# Patient Record
Sex: Female | Born: 1990
Health system: Southern US, Community
[De-identification: ages and names within clinical notes are randomized; demographics above are authoritative.]

## PROBLEM LIST (undated history)

## (undated) ENCOUNTER — Ambulatory Visit (HOSPITAL_COMMUNITY): Admission: EM | Payer: 59 | Source: Home / Self Care

---

## 2016-05-01 ENCOUNTER — Other Ambulatory Visit: Payer: Self-pay | Admitting: Nephrology

## 2016-05-01 DIAGNOSIS — R809 Proteinuria, unspecified: Secondary | ICD-10-CM

## 2016-05-03 ENCOUNTER — Ambulatory Visit
Admission: RE | Admit: 2016-05-03 | Discharge: 2016-05-03 | Disposition: A | Discharge status: Home / Self Care | Payer: BLUE CROSS/BLUE SHIELD | Source: Ambulatory Visit | Attending: Nephrology | Admitting: Nephrology

## 2016-05-03 DIAGNOSIS — R809 Proteinuria, unspecified: Secondary | ICD-10-CM

## 2017-10-15 IMAGING — US US RENAL
1 series · 14 of 25 positions shown · non-contrast
Comparison: None.

CLINICAL DATA: Proteinuria.

EXAM:
RENAL / URINARY TRACT ULTRASOUND COMPLETE

[Series 1: us renal · 0.20mm/px · 14 of 32 slices shown]
[im 1/32]
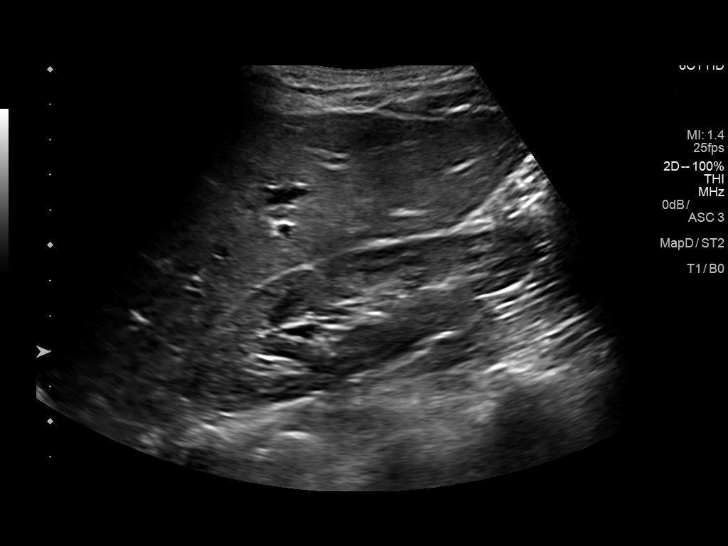
[im 3/32]
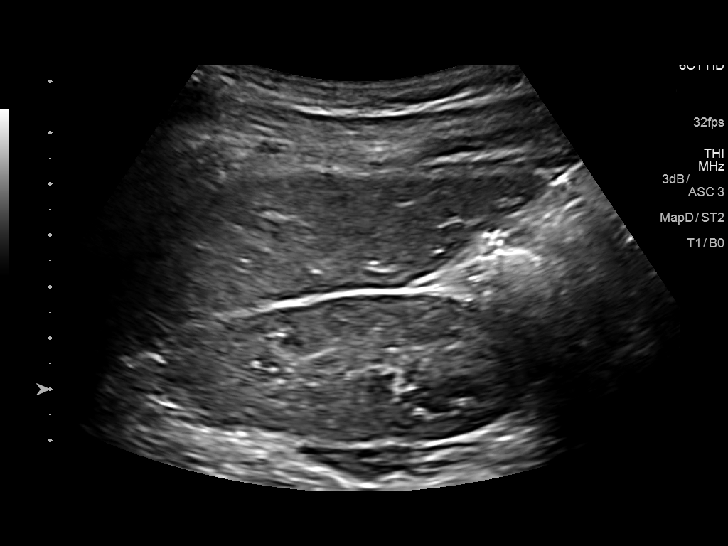
[im 6/32]
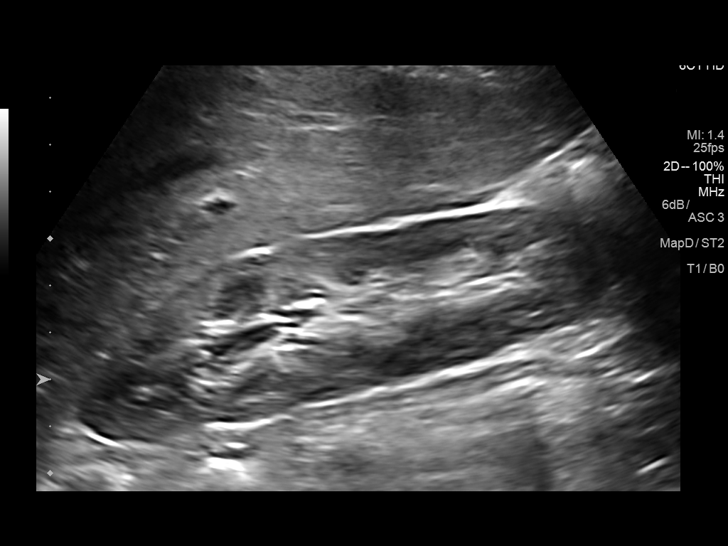
[im 8/32]
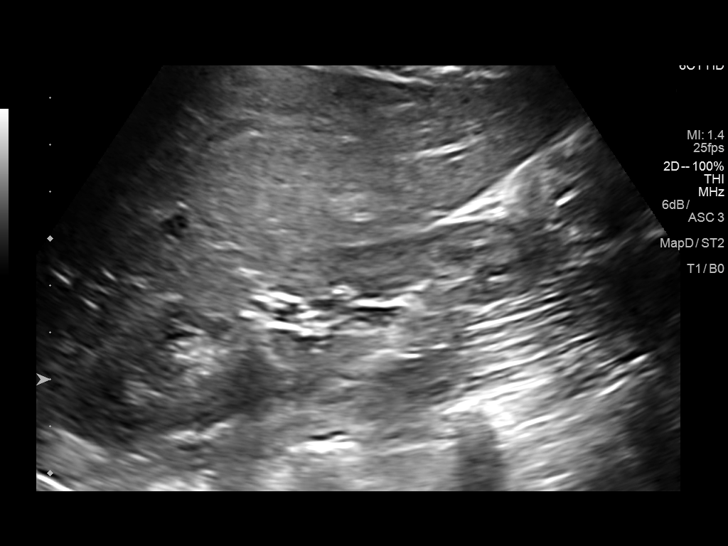
[im 11/32]
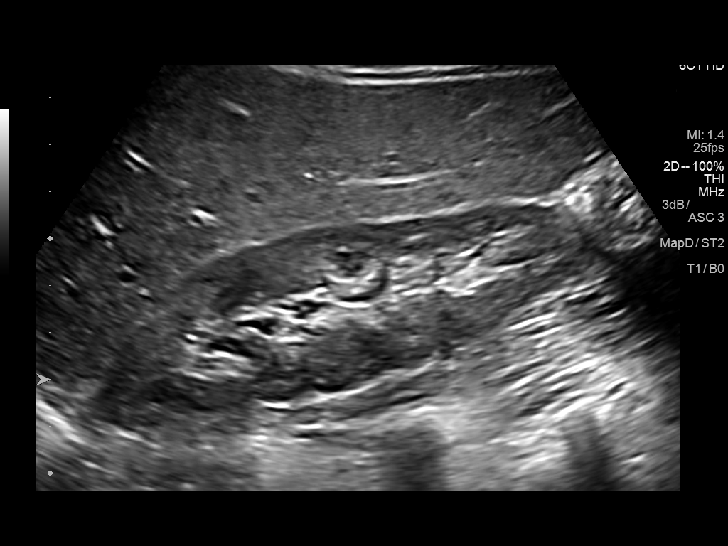
[im 12/32]
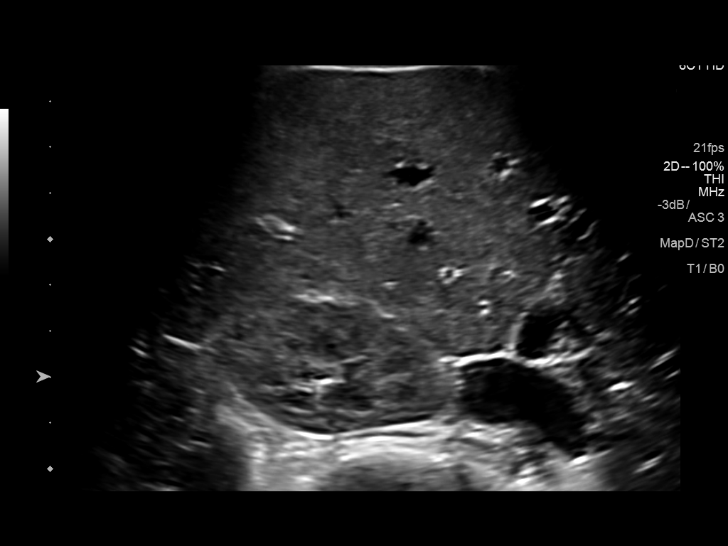
[im 15/32]
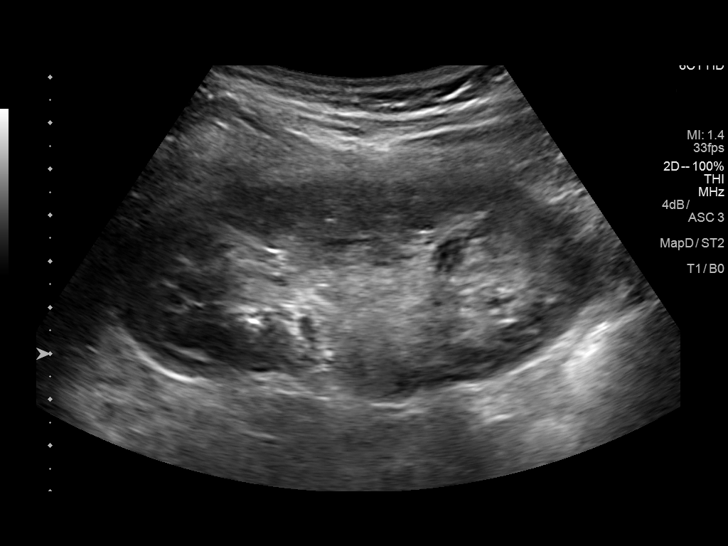
[im 17/32]
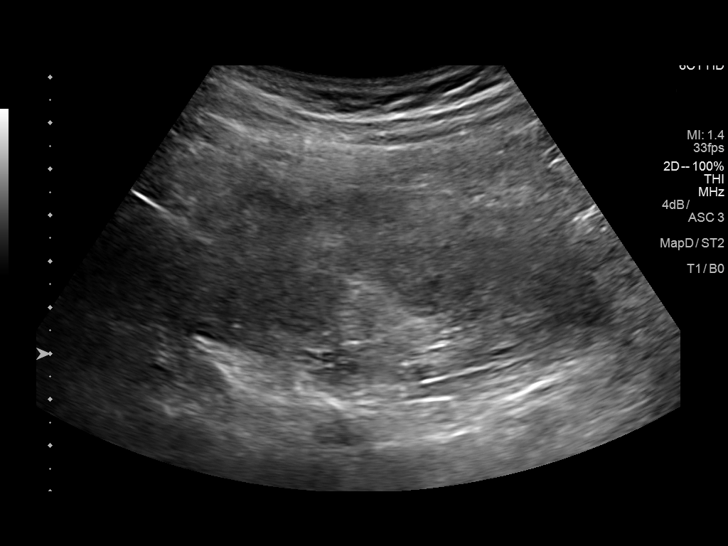
[im 20/32]
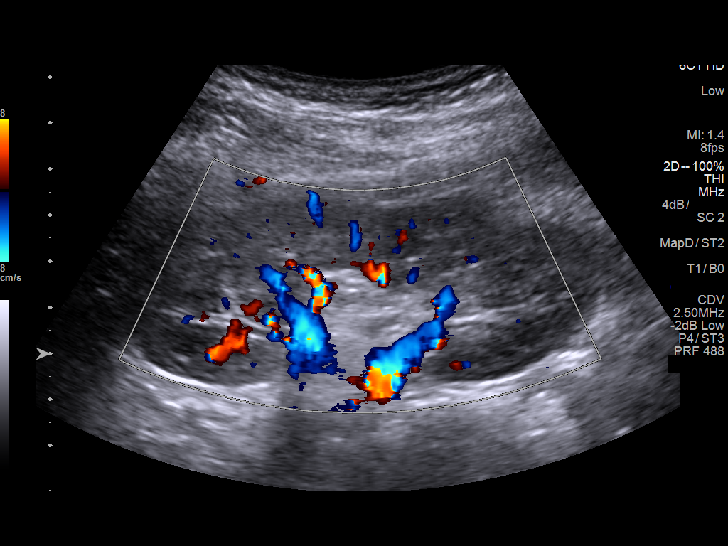
[im 21/32]
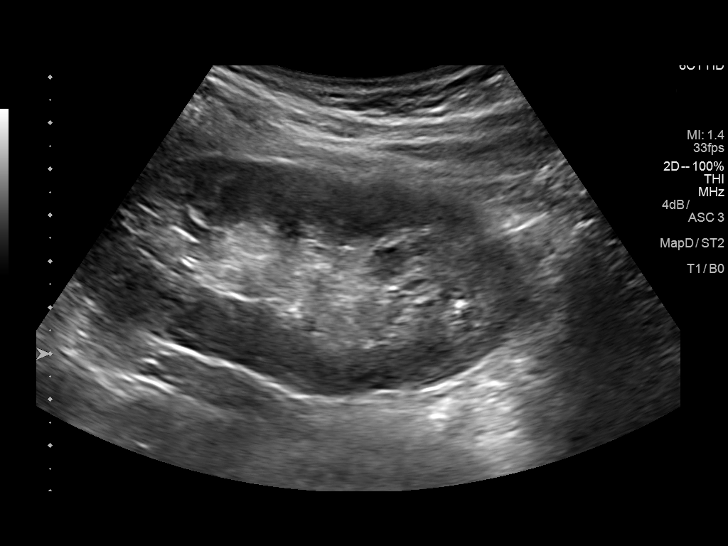
[im 24/32]
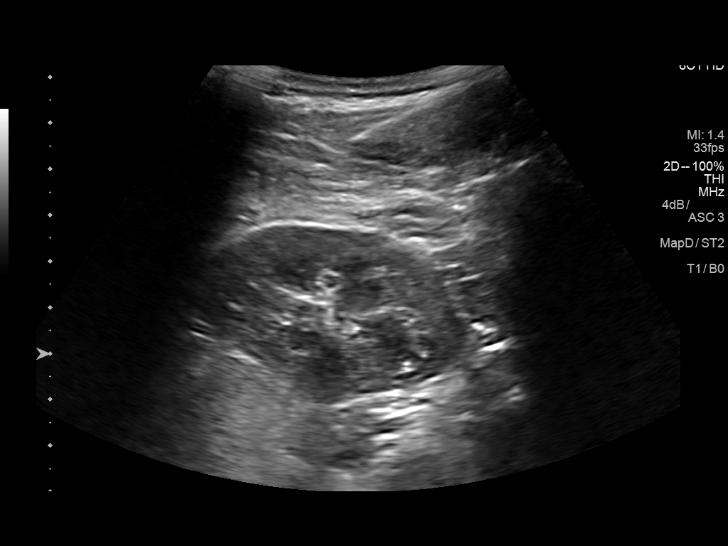
[im 26/32]
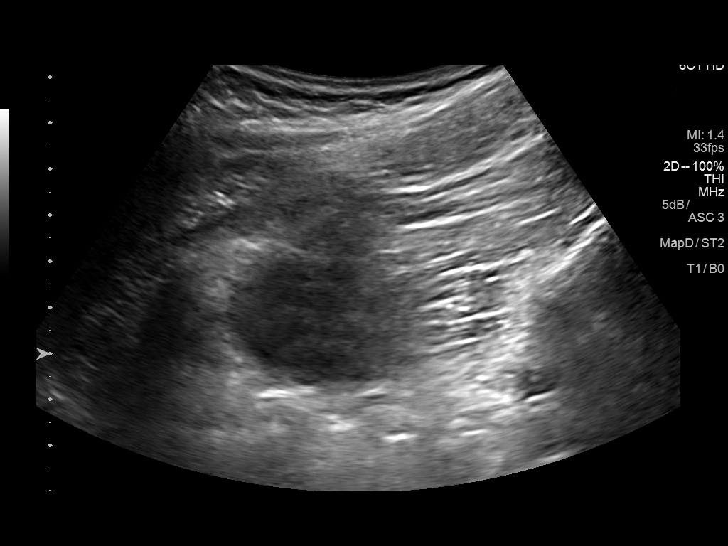
[im 29/32]
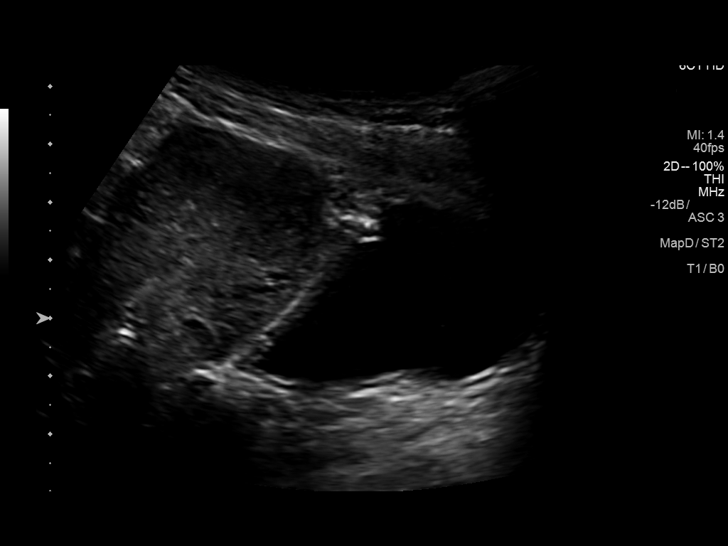
[im 32/32]
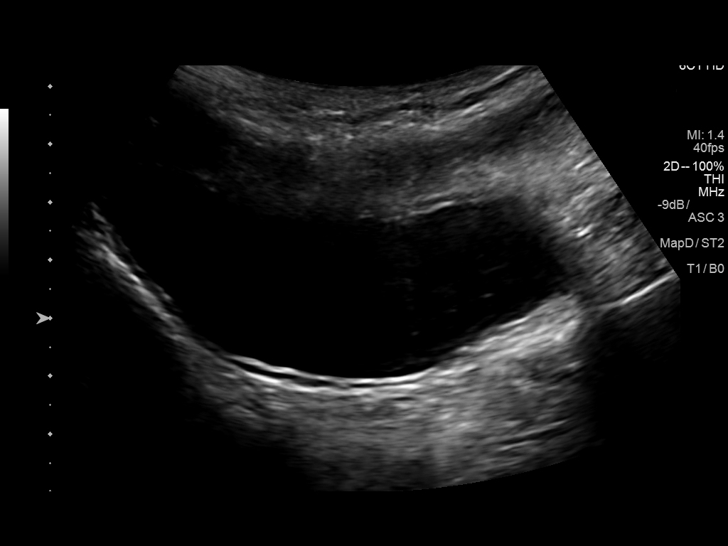

[14 of 25 positions shown; findings below may reference images not displayed]

FINDINGS: Right Kidney:

Length: 12 cm. Echogenicity within normal limits. No mass or
hydronephrosis visualized.

Left Kidney:

Length: 10.5 cm. Echogenicity within normal limits. No mass or
hydronephrosis visualized.

Bladder:

Appears normal for degree of bladder distention.
IMPRESSION: Normal renal ultrasound.

## 2021-02-24 ENCOUNTER — Encounter (HOSPITAL_COMMUNITY): Payer: Self-pay | Admitting: Emergency Medicine

## 2021-02-24 ENCOUNTER — Other Ambulatory Visit: Payer: Self-pay

## 2021-02-24 ENCOUNTER — Ambulatory Visit (HOSPITAL_COMMUNITY)
Admission: EM | Admit: 2021-02-24 | Discharge: 2021-02-24 | Disposition: A | Discharge status: Home / Self Care | Payer: 59 | Attending: Physician Assistant | Admitting: Physician Assistant

## 2021-02-24 DIAGNOSIS — Z23 Encounter for immunization: Secondary | ICD-10-CM | POA: Diagnosis not present

## 2021-02-24 DIAGNOSIS — S61211A Laceration without foreign body of left index finger without damage to nail, initial encounter: Secondary | ICD-10-CM

## 2021-02-24 MED ORDER — TETANUS-DIPHTH-ACELL PERTUSSIS 5-2.5-18.5 LF-MCG/0.5 IM SUSY
0.5000 mL | PREFILLED_SYRINGE | Freq: Once | INTRAMUSCULAR | Status: AC
  Administered 2021-02-24: 0.5 mL via INTRAMUSCULAR

## 2021-02-24 MED ORDER — TETANUS-DIPHTH-ACELL PERTUSSIS 5-2.5-18.5 LF-MCG/0.5 IM SUSY
PREFILLED_SYRINGE | INTRAMUSCULAR | Status: AC
  Filled 2021-02-24: qty 0.5

## 2021-02-24 MED ORDER — LIDOCAINE HCL (PF) 2 % IJ SOLN
INTRAMUSCULAR | Status: AC
  Filled 2021-02-24: qty 5

## 2021-02-24 NOTE — Discharge Instructions (Signed)
Keep this area clean with soap and water.  Use splint when you are going outside for protection.  Your tetanus was updated today.  If you develop any redness, swelling, increased pain, drainage you should be seen immediately to rule out infection.  Assuming this heals well you should return in 10 days for suture removal.  If you have any concerns please return for reevaluation.

## 2021-02-24 NOTE — ED Provider Notes (Signed)
[facility]    CSN: [id number] Arrival date & time: 02/24/21  1115      History   Chief Complaint Chief Complaint  Patient presents with   Finger Laceration    HPI Denise Williamson is a 31 y.o. female.   Patient presents today following injury to her left index finger.  She was cutting open a box when the box cutter slipped and causing a laceration to her proximal left index finger.  She cleaned it with soap and water and immediately came to our clinic.  This happened approximately 30 to 45 minutes ago.  She is unsure when her last tetanus was.  She is confident that she is not pregnant.  She denies any significant allergies to tape or antiseptic products.   History reviewed. No pertinent past medical history.  There are no problems to display for this patient.   History reviewed. No pertinent surgical history.  OB History   No obstetric history on file.      Home Medications    Prior to Admission medications   Not on File    Family History Family History  Problem Relation Age of Onset   Healthy Mother    Healthy Father     Social History Social History   Tobacco Use   Smoking status: Never   Smokeless tobacco: Never  Vaping Use   Vaping Use: Never used     Allergies   Patient has no allergy information on record.   Review of Systems Review of Systems  Constitutional:  Negative for activity change, appetite change, fatigue and fever.  Respiratory:  Negative for cough and shortness of breath.   Cardiovascular:  Negative for chest pain.  Gastrointestinal:  Negative for abdominal pain, diarrhea, nausea and vomiting.  Skin:  Positive for wound.  Neurological:  Negative for dizziness, light-headedness and headaches.    Physical Exam Triage Vital Signs ED Triage Vitals  Enc Vitals Group     BP 02/24/21 1148 113/67     Pulse Rate 02/24/21 1148 75     Resp 02/24/21 1148 18     Temp 02/24/21 1148 97.7 F (36.5 C)     Temp Source  02/24/21 1148 Oral     SpO2 02/24/21 1148 100 %     Weight 02/24/21 1146 112 lb (50.8 kg)     Height 02/24/21 1146 5\' 3"  (1.6 m)     Head Circumference --      Peak Flow --      Pain Score 02/24/21 1146 0     Pain Loc --      Pain Edu? --      Excl. in GC? --    No data found.  Updated Vital Signs BP 113/67 (BP Location: Right Arm)    Pulse 75    Temp 97.7 F (36.5 C) (Oral)    Resp 18    Ht 5\' 3"  (1.6 m)    Wt 112 lb (50.8 kg)    SpO2 100%    BMI 19.84 kg/m   Visual Acuity Right Eye Distance:   Left Eye Distance:   Bilateral Distance:    Right Eye Near:   Left Eye Near:    Bilateral Near:     Physical Exam Vitals reviewed.  Constitutional:      General: She is awake. She is not in acute distress.    Appearance: Normal appearance. She is well-developed. She is not ill-appearing.     Comments: Very pleasant female  appears stated age no acute distress sitting comfortably in exam room  HENT:     Head: Normocephalic and atraumatic.  Cardiovascular:     Rate and Rhythm: Normal rate and regular rhythm.     Heart sounds: Normal heart sounds, S1 normal and S2 normal. No murmur heard. Pulmonary:     Effort: Pulmonary effort is normal.     Breath sounds: Normal breath sounds. No wheezing, rhonchi or rales.     Comments: Clear to auscultation bilaterally Abdominal:     General: Bowel sounds are normal.     Palpations: Abdomen is soft.     Tenderness: There is no abdominal tenderness. There is no right CVA tenderness, left CVA tenderness, guarding or rebound.  Musculoskeletal:     Comments: Normal active range of motion of the left pointer finger.  Skin:    Findings: Laceration present.     Comments: 5 cm laceration over dorsal proximal phalanx of left index finger.  Psychiatric:        Behavior: Behavior is cooperative.     UC Treatments / Results  Labs (all labs ordered are listed, but only abnormal results are displayed) Labs Reviewed - No data to  display  EKG   Radiology No results found.  Procedures Laceration Repair  Date/Time: 02/24/2021 12:24 PM Performed by: Jeani Hawking, PA-C Authorized by: Jeani Hawking, PA-C   Consent:    Consent obtained:  Verbal   Consent given by:  Patient   Risks, benefits, and alternatives were discussed: yes     Risks discussed:  Infection, pain, tendon damage, poor cosmetic result and need for additional repair   Alternatives discussed:  Referral Universal protocol:    Procedure explained and questions answered to patient or proxy's satisfaction: yes     Patient identity confirmed:  Verbally with patient Anesthesia:    Anesthesia method:  Local infiltration   Local anesthetic:  Lidocaine 1% w/o epi Laceration details:    Location:  Finger   Finger location:  L index finger   Length (cm):  5   Depth (mm):  3 Pre-procedure details:    Preparation:  Patient was prepped and draped in usual sterile fashion Exploration:    Limited defect created (wound extended): yes     Hemostasis achieved with:  Direct pressure   Imaging outcome: foreign body not noted     Wound exploration: wound explored through full range of motion     Contaminated: no   Treatment:    Area cleansed with:  Saline and soap and water   Amount of cleaning:  Standard   Irrigation solution:  Sterile saline   Irrigation volume:  10 ML   Irrigation method:  Syringe   Undermining:  None Skin repair:    Repair method:  Sutures   Suture size:  5-0   Suture material:  Prolene   Suture technique:  Simple interrupted   Number of sutures:  6 Approximation:    Approximation:  Close Repair type:    Repair type:  Simple Post-procedure details:    Dressing:  Non-adherent dressing and splint for protection   Procedure completion:  Tolerated well, no immediate complications (including critical care time)  Medications Ordered in UC Medications  Tdap (BOOSTRIX) injection 0.5 mL (has no administration in time range)     Initial Impression / Assessment and Plan / UC Course  I have reviewed the triage vital signs and the nursing notes.  Pertinent labs & imaging results that were  available during my care of the patient were reviewed by me and considered in my medical decision making (see chart for details).     Laceration repair performed in clinic today.  See procedure note above.  Tdap updated.  Patient was encouraged to keep area clean with soap and water.  She was placed in a splint for protection and comfort.  Discussed signs/symptoms of infection that warrant emergent evaluation.  She is to return in 10 days for suture removal unless needed to be seen sooner.  Final Clinical Impressions(s) / UC Diagnoses   Final diagnoses:  Laceration of left index finger without foreign body without damage to nail, initial encounter     Discharge Instructions      Keep this area clean with soap and water.  Use splint when you are going outside for protection.  Your tetanus was updated today.  If you develop any redness, swelling, increased pain, drainage you should be seen immediately to rule out infection.  Assuming this heals well you should return in 10 days for suture removal.  If you have any concerns please return for reevaluation.     ED Prescriptions   None    PDMP not reviewed this encounter.   Jeani Hawking, PA-C 02/24/21 1226

## 2021-02-24 NOTE — ED Triage Notes (Signed)
Pt reports left index  finger laceration. Pt was cutting something with a box cutter. Bleeding controlled. Last tetanus unknown.

## 2021-03-07 ENCOUNTER — Other Ambulatory Visit: Payer: Self-pay

## 2021-03-07 ENCOUNTER — Ambulatory Visit (HOSPITAL_COMMUNITY)
Admission: RE | Admit: 2021-03-07 | Discharge: 2021-03-07 | Disposition: A | Discharge status: Home / Self Care | Payer: 59 | Source: Ambulatory Visit | Attending: Internal Medicine | Admitting: Internal Medicine

## 2021-03-07 DIAGNOSIS — Z4802 Encounter for removal of sutures: Secondary | ICD-10-CM

## 2021-03-07 DIAGNOSIS — S61211D Laceration without foreign body of left index finger without damage to nail, subsequent encounter: Secondary | ICD-10-CM

## 2021-03-07 NOTE — ED Triage Notes (Signed)
Removed 6 sutures from lt index finger. No redness or drainage noted.

## 2021-05-16 ENCOUNTER — Encounter: Payer: Self-pay | Admitting: Orthopedic Surgery

## 2021-05-16 ENCOUNTER — Ambulatory Visit: Payer: Self-pay

## 2021-05-16 ENCOUNTER — Ambulatory Visit (INDEPENDENT_AMBULATORY_CARE_PROVIDER_SITE_OTHER): Payer: 59 | Admitting: Orthopedic Surgery

## 2021-05-16 ENCOUNTER — Ambulatory Visit (INDEPENDENT_AMBULATORY_CARE_PROVIDER_SITE_OTHER): Payer: 59

## 2021-05-16 DIAGNOSIS — M79642 Pain in left hand: Secondary | ICD-10-CM | POA: Diagnosis not present

## 2021-05-16 DIAGNOSIS — M79641 Pain in right hand: Secondary | ICD-10-CM

## 2021-05-16 DIAGNOSIS — M24444 Recurrent dislocation, right finger: Secondary | ICD-10-CM

## 2021-05-16 DIAGNOSIS — M24446 Recurrent dislocation, unspecified finger: Secondary | ICD-10-CM | POA: Insufficient documentation

## 2021-05-16 MED ORDER — MELOXICAM 7.5 MG PO TABS: 7.5000 mg | ORAL_TABLET | Freq: Every day | ORAL | 0 refills | Status: AC

## 2021-05-16 NOTE — Progress Notes (Signed)
? ?Office Visit Note ?  ?Patient: Denise Williamson           ?Date of Birth: [birth year]           ?MRN: [id number] ?Visit Date: 05/16/2021 ?             ?Requested by: Sherilyn Cooter, MD ?[id number] ?[place],  [place] [address] ?PCP: Elmarie Shiley, MD ? ? ?Assessment & Plan: ?Visit Diagnoses:  ?1. Bilateral hand pain   ?2. Chronic or recurrent subluxation of carpometacarpal joint of right thumb   ? ? ?Plan: Discussed with patient that her atraumatic bilateral thumb CMC pain seems most consistent with joint hypermobility.  She does have increased global laxity at the [facility] joint bilaterally.  She does have evidence of hypermobility in other areas including voluntary subluxation of both shoulders and hyperextension of the elbow with a notable clunk.  She has worn a couple different types of braces and done an online therapy program.  She notes that the online exercises seem to help.  We discussed continued conservative management with formal therapy and an oral anti-inflammatory for pain relief.  We briefly discussed surgical stabilization of the [facility] joint.  Patient has decided to try formal therapy and an oral anti-inflammatory medication.  I can see her in 4 to 6 weeks to see how she is doing. ? ?Follow-Up Instructions: No follow-ups on file.  ? ?Orders:  ?Orders Placed This Encounter  ?Procedures  ? XR Hand Complete Right  ? XR Hand Complete Left  ? ?No orders of the defined types were placed in this encounter. ? ? ? ? Procedures: ?No procedures performed ? ? ?Clinical Data: ?No additional findings. ? ? ?Subjective: ?Chief Complaint  ?Patient presents with  ? Left Hand - New Patient (Initial Visit)  ? Right Hand - New Patient (Initial Visit)  ? ? ?This is a 31 year old right-hand-dominant female who presents with bilateral atraumatic thumb pain.  Patient brings with her a document outlining the nature and timeline of her issue.  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  Her symptoms started around 2021.  She started having pain and fatigue at the right thumb CMC joint.  This would resolve with rest but then would flare up again.  Starting about October 2021 she continues to have chronic pain, stiffness, and limited function in this thumb.  The pain was loosely localized to the thumb [facility] joint but now involves the entire thumb.  She describes her hand as feeling "tight." She has less severe symptoms involving the index finger MCP and PIP joint, and dorsum of the wrist.  She has difficulty with independent function.  She has significant pain with work.  She is unable to do her normal hobbies including cooking, making art.  She difficulty with daily tasks and is driving, doing laundry, getting dressed, washing her hair, etc.  She has tried braces including a MetaGrip brace, K tape, silicone thumb supports.  She is an online therapy program which she actually thinks is helping.  She has worn compression gloves.  She done heat therapy.  She has taken Tylenol.  Her left hand pain started over the last month or so and is localized at the [facility] joint.  This is mostly with doing heavier activities.  It is much less severe than the right side.  She notes that she has some global laxity including ability to voluntarily sublux her shoulders  and hyperextension of both elbows with a clunking sensation.  She does have a dad with rheumatoid arthritis although she is unaware of the severity or his treatment. ? ? ?Review of Systems ? ? ?Objective: ?Vital Signs: There were no vitals taken for this visit. ? ?Physical Exam ?Constitutional:   ?   Appearance: Normal appearance.  ?   Comments: Thin-appearing  ?Cardiovascular:  ?   Rate and Rhythm: Normal rate and regular rhythm.  ?Pulmonary:  ?   Effort: Pulmonary effort is normal.  ?Skin: ?   General: Skin is warm and dry.  ?   Capillary Refill: Capillary refill takes less than 2 seconds.  ?Neurological:  ?   Mental Status: She is  alert.  ? ? ?Right Hand Exam  ? ?Tenderness  ?The patient is experiencing no tenderness.  ? ?Other  ?Erythema: absent ?Sensation: normal ?Pulse: present ? ?Comments:  Global hyper-mobility at [facility] joint w/ passive joint subluxation with manipulation.  Negative CMC grind test.  Passive hyper-extension at the MP joint. Full AROM of thumb.  No thumb swelling. No instability at the index MP and PIP joints.  ? ? ?Left Hand Exam  ? ?Tenderness  ?The patient is experiencing no tenderness.  ? ?Other  ?Erythema: absent ?Sensation: normal ?Pulse: present ? ?Comments:  Global hyper-mobility at [facility] joint w/ passive joint subluxation with manipulation.  Negative CMC grind test.  Passive hyper-extension at the MP joint.  Full AROM of thumb.  No thumb swelling. ? ? ?Right Elbow Exam  ? ?Tenderness  ?The patient is experiencing no tenderness.  ? ?Comments:  Hyper-extension of elbow w/ palpable and audible clunk.  ? ? ?Left Elbow Exam  ? ?Tenderness  ?The patient is experiencing no tenderness.  ? ?Comments:  Hyper-extension of elbow w/ palpable and audible clunk.  ? ? ? ? ?Specialty Comments:  ?No specialty comments available. ? ?Imaging: ?No results found. ? ? ?PMFS History: ?Patient Active Problem List  ? Diagnosis Date Noted  ? Chronic or recurrent subluxation of carpometacarpal joint of thumb 05/16/2021  ? ?History reviewed. No pertinent past medical history.  ?Family History  ?Problem Relation Age of Onset  ? Healthy Mother   ? Healthy Father   ?  ?History reviewed. No pertinent surgical history. ?Social History  ? ?Occupational History  ? Not on file  ?Tobacco Use  ? Smoking status: Never  ? Smokeless tobacco: Never  ?Vaping Use  ? Vaping Use: Never used  ?Substance and Sexual Activity  ? Alcohol use: Not on file  ? Drug use: Not on file  ? Sexual activity: Not on file  ? ? ? ? ? ? ?

## 2021-05-23 ENCOUNTER — Ambulatory Visit: Payer: 59 | Admitting: Rehabilitative and Restorative Service Providers"

## 2021-05-23 ENCOUNTER — Other Ambulatory Visit: Payer: Self-pay

## 2021-05-23 ENCOUNTER — Encounter: Payer: Self-pay | Admitting: Rehabilitative and Restorative Service Providers"

## 2021-05-23 DIAGNOSIS — M6281 Muscle weakness (generalized): Secondary | ICD-10-CM

## 2021-05-23 DIAGNOSIS — M25631 Stiffness of right wrist, not elsewhere classified: Secondary | ICD-10-CM

## 2021-05-23 DIAGNOSIS — M25531 Pain in right wrist: Secondary | ICD-10-CM | POA: Diagnosis not present

## 2021-05-23 DIAGNOSIS — M79641 Pain in right hand: Secondary | ICD-10-CM

## 2021-05-23 DIAGNOSIS — M79642 Pain in left hand: Secondary | ICD-10-CM

## 2021-05-23 NOTE — Therapy (Signed)
OUTPATIENT OCCUPATIONAL THERAPY ORTHO EVALUATION  Patient Name: Tagen Ingoldsby MRN: [id number] DOB:1990/04/30, 31 y.o., female Today's Date: 05/23/2021  PCP: Dr. Zetta Bills REFERRING PROVIDER: Dr. Marlyne Beards   OT End of Session - 05/23/21 1033     Visit Number 1    Number of Visits 10    Date for OT Re-Evaluation 07/07/21    OT Start Time 1028    OT Stop Time 1120    OT Time Calculation (min) 52 min    Activity Tolerance Patient tolerated treatment well;Patient limited by fatigue;Patient limited by pain    Behavior During Therapy [facility] for tasks assessed/performed             History reviewed. No pertinent past medical history. History reviewed. No pertinent surgical history. Patient Active Problem List   Diagnosis Date Noted   Chronic or recurrent subluxation of carpometacarpal joint of thumb 05/16/2021    ONSET DATE: pain first started in Oct 2021, got very bad in Oct 2022  REFERRING DIAG:  [phone] (ICD-10-CM) - Bilateral hand pain  M24.444 (ICD-10-CM) - Chronic or recurrent subluxation of carpometacarpal joint of right thumb    THERAPY DIAG:  Muscle weakness (generalized)  Pain in left hand  Pain in right hand  Pain in right wrist  Stiffness of right wrist, not elsewhere classified  SUBJECTIVE:   SUBJECTIVE STATEMENT: 05/23/21 Eval: She states having chronic pain everyday with no relief and she can't do the things she needs or wants to do. 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 She states wanting to learn adaptations and modifications. She states doing hand exercises every day which has helped somewhat, she has worn supports, used heat, used theraputty, k-tape, etc. Right hand is much worse than left hand in terms of pain, and CMC Js are the worst of the pain symptoms. She never really feels any clicking or popping or subluxation, etc. She also states having  lots of fatigue in hands through shoulders.     PERTINENT HISTORY: Per MD: "Bilateral atraumatic thumb pain, mostly at [facility] joint.  Evidence of hyper-mobility."  PRECAUTIONS: None  WEIGHT BEARING RESTRICTIONS No  PAIN:  Are you having pain? 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 No  LIVING ENVIRONMENT: Lives with: lives with their partner  PLOF: Independent  PATIENT GOALS To have less pain at rest and with activities and to be able to cross-stitch again.   OBJECTIVE:   HAND DOMINANCE: Right  ADLs: Overall ADLs: She states severe pain/problems with light, everyday ADLs like using silverware, opening containers, cooking, etc. She states needing to use k-tape, compression gloves and thumb CMC J brace when at work to "get through it."   FUNCTIONAL OUTCOME MEASURES: 05/23/21: Neldon Mc: 63.6% impairment today   UE ROM   05/23/21: b/l hands make full tight fist, full opposition, does feel tightness/stretches with PROM to thumb joints in flex, opposition, etc.   Active ROM Right 05/23/2021 Left 05/23/2021  Shoulder flexion    Shoulder abduction    Shoulder adduction    Shoulder extension    Shoulder internal rotation    Shoulder external rotation    Elbow flexion    Elbow extension    Wrist flexion 81 69  Wrist extension 81 87  Wrist ulnar deviation Tender passively   Wrist radial deviation    Wrist pronation ~85 ~85  Wrist supination ~85 ~85  (Blank rows = not tested)   UE MMT:   05/23/21: grossly lower tone, but at least 4/5 at most joints, joints around b/l hands/wrists are somewhat painful with resistance. More detailed MMT will be done next session.  MMT Right 05/23/2021 Left 05/23/2021  Shoulder flexion    Shoulder abduction    Shoulder adduction    Shoulder extension    Shoulder internal rotation    Shoulder external rotation     Middle trapezius    Lower trapezius    Elbow flexion    Elbow extension    Wrist flexion    Wrist extension    Wrist ulnar deviation    Wrist radial deviation    Wrist pronation    Wrist supination    (Blank rows = not tested)  HAND FUNCTION: Grip strength: Right: 29.5 lbs; Left: 34.7 lbs and 3 point pinch: Right: 5 lbs, Left: 8 lbs  COORDINATION: 9 Hole Peg test: Right: 21.7 sec; Left: 21.9 sec (20-22 sec is WFL) no significant pain during  SENSATION: 3-4 mm in ulnar and median N distributions in hands. Great  EDEMA: none significant 05/23/21  COGNITION: Overall cognitive status: Within functional limits for tasks assessed  OBSERVATIONS:  05/23/21: She has mild positive Finkelstein's test in right wrist, TTP at 1st dorsal compartment, also TTP at right thumb CMC J.  Neg scaphoid clunk, but DRUJ is very flexible (non-painful). She can bend her fingers back with hyperextension. She has soft, milky skin that is fairly "elastic."   Left hand is less painful to palpation and testing, but tight in similar areas.   For current management, she has been doing 1-2 a day, sets of 10 each: t-glides, thumb slides, IF abd (no resistance) thumb palmar abduction (no resistance) FMS pick up coins, etc. She states inc. Pain from FMS when doing.   TODAY'S TREATMENT:  05/23/21 Eval: OT reviews her current home program and modifies to make more effective for b/l thumb pain (by adding resistance, changing dosage) and also adds new stretches at thumb and wrist and also encourages rest and continued modality use between sessions. She was also given built-up foam for increasing utensil size with activity modification education. She states understanding and tolerating everything with no increased pain so far.   Exercises (3-4xday, esp in AM) Rt wrist ulnar deviation stretch 3-5x 15sec B/l wrist prayer stretches 3-5x 15sec B/l thumb palmar abd stretches 3-5x 15sec Tendon glides 3x with holds Isometric  1s DI strength 3-5x 5 sec Isometric thumb opponens strength 3-5x 5 sec    PATIENT EDUCATION: Education details: See tx section above for details  Person educated: Patient Education method: Explanation, Demonstration, Verbal cues, and Handouts Education comprehension: verbalized understanding, returned demonstration, verbal cues required, and needs further education   HOME EXERCISE PROGRAM: See above tx section for details   GOALS: Goals reviewed with patient? Yes  SHORT TERM GOALS: (STG required if POC>30 days)  Pt will obtain protective, custom orthotic. Target date: 06/02/21 Goal status: INITIAL  2.  Pt will demo/state understanding of initial HEP to improve pain levels and prerequisite motion. Target date: 06/09/21 Goal status: INITIAL   LONG TERM GOALS:  Pt will improve functional ability by decreased impairment per Quick DASH assessment from 63% to 15% or better, for better quality of life. Target date: 07/07/21 Goal status: INITIAL  2.  Pt will improve grip strength in b/l hands from painful ~30lbs each to at least 50lbs each for functional use at home and in IADLs. Target date: 07/07/21 Goal status: INITIAL  3.  Pt will improve A/ROM in right wrist ext from 81* to at least 85* non-painfully, to have decreased pain with functional motion during tasks like reach and grasp.  Target date: 07/07/21 Goal status: INITIAL  4.  Pt will decrease pain at worst from 10/10 to 5/10 or better to have better sleep and occupational participation in daily roles. Target date: 07/07/21 Goal status: INITIAL  5.  Pt will improve strength in TBD from TBD MMT to at least TBD MMT to have increased functional ability to carry out selfcare and higher-level homecare tasks with no difficulty. Target date: 07/07/21 Goal status: NEW GOAL WILL BE ADDED NEXT WEEK @ visit 2    ASSESSMENT:  CLINICAL IMPRESSION: Patient is a 31 y.o. female who was seen today for occupational therapy evaluation for  b/l hand pain and decreased functional ability. This seems to be caused by a mix of weakness, joint laxity, repetitive tasks, but may also have underlying, systemic causes (concern for Ehlers Danlos syndrome, etc.). Also seems to have generalized weakness and also c/o b/l shoulder soreness/tiredness at times (low tone issues).   PERFORMANCE DEFICITS in functional skills including ADLs, IADLs, coordination, dexterity, tone, ROM, strength, pain, fascial restrictions, flexibility, GMC, body mechanics, endurance, decreased knowledge of precautions, decreased knowledge of use of DME, and UE functional use and psychosocial skills including coping strategies, environmental adaptation, habits, interpersonal interactions, and routines and behaviors.   IMPAIRMENTS are limiting patient from ADLs, IADLs, rest and sleep, work, play, leisure, and social participation.   COMORBIDITIES may have co-morbidities  that affects occupational performance. Patient will benefit from skilled OT to address above impairments and improve overall function.  MODIFICATION OR ASSISTANCE TO COMPLETE EVALUATION: Min-Moderate modification of tasks or assist with assess necessary to complete an evaluation.  OT OCCUPATIONAL PROFILE AND HISTORY: Detailed assessment: Review of records and additional review of physical, cognitive, psychosocial history related to current functional performance.  CLINICAL DECISION MAKING: Moderate - several treatment options, min-mod task modification necessary  REHAB POTENTIAL: Good  EVALUATION COMPLEXITY: Moderate      PLAN: OT FREQUENCY: 2x/week  OT DURATION: 6 weeks  PLANNED INTERVENTIONS: self care/ADL training, therapeutic exercise, therapeutic activity, neuromuscular re-education, manual therapy, passive range of motion, splinting, fluidotherapy, compression bandaging, moist heat, cryotherapy, contrast bath, patient/family education, energy conservation, coping strategies training, and DME  and/or AE instructions  RECOMMENDED OTHER SERVICES: none now   CONSULTED AND AGREED WITH PLAN OF CARE: Patient  PLAN FOR NEXT SESSION: Go over current HEP, do more detailed MMT, fabricate rt hand thumb MCP & CMC immobilization orthotic, upgrade goals and HEP as needed   Fannie Knee, OTR/L, CHT 05/23/2021, 12:33 PM

## 2021-05-30 ENCOUNTER — Encounter: Payer: Self-pay | Admitting: Rehabilitative and Restorative Service Providers"

## 2021-05-30 ENCOUNTER — Ambulatory Visit: Payer: 59 | Admitting: Rehabilitative and Restorative Service Providers"

## 2021-05-30 DIAGNOSIS — M25631 Stiffness of right wrist, not elsewhere classified: Secondary | ICD-10-CM

## 2021-05-30 DIAGNOSIS — M25531 Pain in right wrist: Secondary | ICD-10-CM

## 2021-05-30 DIAGNOSIS — M6281 Muscle weakness (generalized): Secondary | ICD-10-CM | POA: Diagnosis not present

## 2021-05-30 DIAGNOSIS — M79641 Pain in right hand: Secondary | ICD-10-CM | POA: Diagnosis not present

## 2021-05-30 DIAGNOSIS — M79642 Pain in left hand: Secondary | ICD-10-CM

## 2021-05-30 NOTE — Therapy (Signed)
?OUTPATIENT OCCUPATIONAL THERAPY TREATMENT NOTE ? ? ?Patient Name: Denise Williamson ?MRN: [id number] ?DOB:February 13, 1990, 31 y.o., female ?Today's Date: 05/30/2021 ? ?PCP: Dr. Zetta Bills ?REFERRING PROVIDER: Dr. Marlyne Beards ? ?END OF SESSION:  ? OT End of Session - 05/30/21 1145   ? ? Visit Number 2   ? Number of Visits 10   ? Date for OT Re-Evaluation 07/07/21   ? OT Start Time 1145   ? OT Stop Time 1243   ? OT Time Calculation (min) 58 min   ? Activity Tolerance Patient tolerated treatment well;Patient limited by fatigue;Patient limited by pain   ? Behavior During Therapy [facility] for tasks assessed/performed   ? ?  ?  ? ?  ? ? ?History reviewed. No pertinent past medical history. ?History reviewed. No pertinent surgical history. ?Patient Active Problem List  ? Diagnosis Date Noted  ? Chronic or recurrent subluxation of carpometacarpal joint of thumb 05/16/2021  ? ? ?ONSET DATE: pain first started in Oct 2021, got very bad in Oct 2022 ?  ?REFERRING DIAG:  ?M79.641,M79.642 (ICD-10-CM) - Bilateral hand pain  ?M24.444 (ICD-10-CM) - Chronic or recurrent subluxation of carpometacarpal joint of right thumb  ? ? ?THERAPY DIAG:  ?Muscle weakness (generalized) ? ?Pain in left hand ? ?Pain in right hand ? ?Stiffness of right wrist, not elsewhere classified ? ?Pain in right wrist ? ? ?PERTINENT HISTORY: Per MD: "Bilateral atraumatic thumb pain, mostly at [facility] joint.  Evidence of hyper-mobility." ?  ?PRECAUTIONS: None ? ?SUBJECTIVE:  ?She states doing exercises and stretches "feel great." Still feels like she needs more rigid support in right hand.  ? ? ?PAIN:  ?Are you having pain? Yes, Rt thumb  ?Rating: 4/10 at rest today ? ? ? ?OBJECTIVE: (All objective assessments below are from initial evaluation on: 05/23/21 unless otherwise specified.)  ? ?HAND DOMINANCE: Right ?  ?ADLs: ?Overall ADLs: She states severe pain/problems with light, everyday ADLs like using silverware, opening containers, cooking, etc. She states needing to use  k-tape, compression gloves and thumb CMC J brace when at work to "get through it."  ?  ?FUNCTIONAL OUTCOME MEASURES: ?05/23/21: Neldon Mc: 63.6% impairment today  ?  ?UE ROM   05/23/21: b/l hands make full tight fist, full opposition, does feel tightness/stretches with PROM to thumb joints in flex, opposition, etc.  ?  ?Active ROM Right ?05/23/2021 Left ?05/23/2021  ?Shoulder flexion      ?Shoulder abduction      ?Shoulder adduction      ?Shoulder extension      ?Shoulder internal rotation      ?Shoulder external rotation      ?Elbow flexion      ?Elbow extension      ?Wrist flexion 81 69  ?Wrist extension 81 87  ?Wrist ulnar deviation Tender passively    ?Wrist radial deviation      ?Wrist pronation ~85 ~85  ?Wrist supination ~85 ~85  ?(Blank rows = not tested) ?  ?  ?UE MMT:   05/23/21: grossly lower tone, but at least 4/5 at most joints, joints around b/l hands/wrists are somewhat painful with resistance. More detailed MMT will be done next session as able. ?  ?MMT Right ?05/30/2021 Left ?05/30/2021  ?Shoulder flexion      ?Shoulder abduction      ?Shoulder adduction      ?Shoulder extension      ?Shoulder internal rotation      ?Shoulder external rotation      ?Middle trapezius      ?  Lower trapezius      ?Elbow flexion      ?Elbow extension      ?Wrist flexion      ?Wrist extension      ?Wrist ulnar deviation      ?Wrist radial deviation      ?Wrist pronation      ?Wrist supination      ?(Blank rows = not tested) ?  ?HAND FUNCTION: ?Grip strength: Right: 29.5 lbs; Left: 34.7 lbs and 3 point pinch: Right: 5 lbs, Left: 8 lbs ?  ?COORDINATION: ?9 Hole Peg test: Right: 21.7 sec; Left: 21.9 sec (20-22 sec is WFL) no significant pain during ?  ?SENSATION: ?3-4 mm in ulnar and median N distributions in hands. Great ?  ?EDEMA: none significant 05/23/21 ?  ?COGNITION: ?Overall cognitive status: Within functional limits for tasks assessed ?  ?OBSERVATIONS:  ?05/30/21: She is less TTP today at snuffbox of rt wrist, tolerates gripping  with hand/thumb better as well.  ? ? ?05/23/21: She has mild positive Finkelstein's test in right wrist, TTP at 1st dorsal compartment, also TTP at right thumb CMC J.  Neg scaphoid clunk, but DRUJ is very flexible (non-painful). She can bend her fingers back with hyperextension. She has soft, milky skin that is fairly "elastic."  ?  ?Left hand is less painful to palpation and testing, but tight in similar areas.  ?  ?For current management, she has been doing 1-2 a day, sets of 10 each: t-glides, thumb slides, IF abd (no resistance) thumb palmar abduction (no resistance) FMS pick up coins, etc. She states inc. Pain from FMS when doing. ?  ?  ?TODAY'S TREATMENT:  ?05/30/21: Custom orthotic fabrication was indicated due to pt's sore and painful right thumb CMC J and need for safe, functional positioning. OT fabricated custom hand-based thumb CMC and MCP blocking orthotic for pt today to rest and immobilize base of thumb during tough activities. It fit well with no areas of pressure, pt states a comfortable fit. Pt was educated on the wearing schedule, to call or come in ASAP if it is causing any irritation or is not achieving desired function. It will be checked/adjusted in upcoming sessions, as needed. Pt states understanding.  ? ?OT also reviews her exercise program with her in depth for both thumb CMC J pain and wrist/thumb DeQuervain's type issues as below. She is able to perform well without added pain. She is also re-educated to avoid repetition, rest, and avoid pain, etc.  ? ?Exercises ?- Seated Wrist Flexion Stretch  - 2-3 x daily - 3 reps - 15 hold ?- Wrist Extension Stretch Pronated  - 2-3 x daily - 3 reps - 15 hold ?- Stretch thumb downward   - 2-3 x daily - 3-5 reps - 15 sec hold ?- Stretch Thumb into "C-Shape" (don't use other thumb)  - 2-3 x daily - 3-5 reps - 15 sec hold ?- Towel Roll Grip with Forearm in Neutral  - 2-3 x daily - 3-5 reps - 10 sec hold ?- Resisted Finger Abduction - Index and Middle  - 2-3  x daily - 5-10 reps - 2-3 sec hold ?- C-Strength (try using rubber band)   - 2-3 x daily - 5-10 reps - 5-10 sec hold ?- Thumb Opposition  - 2-3 x daily - 10 reps ?- ECCENTRIC HAMMER (SLOWLY LET DOWN)   - 2-3 x daily - 1-2 sets - 5-15 reps ? ? ? ?05/23/21 Eval: OT reviews her current home program  and modifies to make more effective for b/l thumb pain (by adding resistance, changing dosage) and also adds new stretches at thumb and wrist and also encourages rest and continued modality use between sessions. She was also given built-up foam for increasing utensil size with activity modification education. She states understanding and tolerating everything with no increased pain so far.  ?  ?Exercises (3-4xday, esp in AM) ?Rt wrist ulnar deviation stretch 3-5x 15sec ?B/l wrist prayer stretches 3-5x 15sec ?B/l thumb palmar abd stretches 3-5x 15sec ?Tendon glides 3x with holds ?Isometric 1s DI strength 3-5x 5 sec ?Isometric thumb opponens strength 3-5x 5 sec ?  ?PATIENT EDUCATION: ?Education details: See tx section above for details  ?Person educated: Patient ?Education method: Explanation, Demonstration, Verbal cues, and Handouts ?Education comprehension: verbalized understanding, returned demonstration, verbal cues required, and needs further education ?  ?  ?HOME EXERCISE PROGRAM: ?Access Code: [email or web address] ?URL: [email or web address] ?Prepared by: Fannie KneeNathanael Travante Knee ?  ?GOALS: ?Goals reviewed with patient? Yes ?  ?SHORT TERM GOALS: (STG required if POC>30 days) ?  ?Pt will obtain protective, custom orthotic. ?Target date: 06/02/21 ?Goal status: INITIAL ?  ?2.  Pt will demo/state understanding of initial HEP to improve pain levels and prerequisite motion. ?Target date: 06/09/21 ?Goal status: INITIAL ?  ?  ?LONG TERM GOALS: ?  ?Pt will improve functional ability by decreased impairment per Quick DASH assessment from 63% to 15% or better, for better quality of life. ?Target date: 07/07/21 ?Goal status: INITIAL ?  ?2.  Pt  will improve grip strength in b/l hands from painful ~30lbs each to at least 50lbs each for functional use at home and in IADLs. ?Target date: 07/07/21 ?Goal status: INITIAL ?  ?3.  Pt will improve A/R

## 2021-06-01 ENCOUNTER — Encounter: Payer: 59 | Admitting: Rehabilitative and Restorative Service Providers"

## 2021-06-13 ENCOUNTER — Encounter: Payer: Self-pay | Admitting: Rehabilitative and Restorative Service Providers"

## 2021-06-13 ENCOUNTER — Ambulatory Visit: Payer: 59 | Admitting: Rehabilitative and Restorative Service Providers"

## 2021-06-13 DIAGNOSIS — M25531 Pain in right wrist: Secondary | ICD-10-CM

## 2021-06-13 DIAGNOSIS — M79641 Pain in right hand: Secondary | ICD-10-CM | POA: Diagnosis not present

## 2021-06-13 DIAGNOSIS — M79642 Pain in left hand: Secondary | ICD-10-CM

## 2021-06-13 DIAGNOSIS — M6281 Muscle weakness (generalized): Secondary | ICD-10-CM | POA: Diagnosis not present

## 2021-06-13 DIAGNOSIS — M25631 Stiffness of right wrist, not elsewhere classified: Secondary | ICD-10-CM

## 2021-06-13 NOTE — Therapy (Signed)
OUTPATIENT OCCUPATIONAL THERAPY TREATMENT NOTE   Patient Name: Denise Williamson MRN: [id number] DOB:08-01-1990, 31 y.o., female Today's Date: 06/13/2021  PCP: Dr. Elmarie Shiley REFERRING PROVIDER: Dr. Sherilyn Cooter  END OF SESSION:   OT End of Session - 06/13/21 1053     Visit Number 3    Number of Visits 10    Date for OT Re-Evaluation 07/07/21    OT Start Time 1055    OT Stop Time 1147    OT Time Calculation (min) 52 min    Activity Tolerance Patient tolerated treatment well;Patient limited by fatigue;Patient limited by pain    Behavior During Therapy [facility] for tasks assessed/performed              History reviewed. No pertinent past medical history. History reviewed. No pertinent surgical history. Patient Active Problem List   Diagnosis Date Noted   Chronic or recurrent subluxation of carpometacarpal joint of thumb 05/16/2021    ONSET DATE: pain first started in Oct 2021, got very bad in Oct 2022   REFERRING DIAG:  [phone] (ICD-10-CM) - Bilateral hand pain  M24.444 (ICD-10-CM) - Chronic or recurrent subluxation of carpometacarpal joint of right thumb    THERAPY DIAG:  Pain in left hand  Pain in right hand  Pain in right wrist  Muscle weakness (generalized)  Stiffness of right wrist, not elsewhere classified   PERTINENT HISTORY: Per MD: "Bilateral atraumatic thumb pain, mostly at [facility] joint.  Evidence of hyper-mobility."   PRECAUTIONS: None  SUBJECTIVE:  She states putting an offer on a home, being very busy, brace helping at work a lot, pain is much lower now and fatigue seems to be the biggest issue.   PAIN:  Are you having pain?  Yes, Rt thumb  Rating: 1/10 at rest today    OBJECTIVE: (All objective assessments below are from initial evaluation on: 05/23/21 unless otherwise specified.)   HAND DOMINANCE: Right   ADLs: Overall ADLs: Eval: She states severe pain/problems with light, everyday ADLs like using silverware, opening  containers, cooking, etc. She states needing to use k-tape, compression gloves and thumb CMC J brace when at work to "get through it."    FUNCTIONAL OUTCOME MEASURES: 06/13/21: Mikael Spray Dash: 45% impairment today   05/23/21: Katina Dung: 63.6% impairment today     UE ROM   05/23/21: b/l hands make full tight fist, full opposition, does feel tightness/stretches with PROM to thumb joints in flex, opposition, etc.    Active ROM Right 05/23/2021 Left 05/23/2021 Right / Left 06/13/21  Thumb MCP J ext-flexion     (-8*) - 46 (right)  Thumb IP ext - flex      0 - 58  (right)          Shoulder extension       Shoulder internal rotation       Shoulder external rotation       Elbow flexion       Elbow extension       Wrist flexion 81 69 79 / 78  Wrist extension 81 87 81 / 78  Wrist ulnar deviation Tender passively     Wrist radial deviation       Wrist pronation ~85 ~85   Wrist supination ~85 ~85   (Blank rows = not tested)     UE MMT:   05/23/21: grossly lower tone, but at least 4/5 at most joints, joints around b/l hands/wrists are somewhat painful with resistance. More detailed MMT will be done next  session as able.   MMT Right  Left   Shoulder flexion      Shoulder abduction      Shoulder adduction      Shoulder extension      Shoulder internal rotation      Shoulder external rotation      Middle trapezius      Lower trapezius      Elbow flexion      Elbow extension      Wrist flexion      Wrist extension      Wrist ulnar deviation      Wrist radial deviation      Wrist pronation      Wrist supination      (Blank rows = not tested)   HAND FUNCTION: 06/13/21: Grip strength: Right: 36.8 lbs; Left: 30.8 lbs and 3 point pinch: Right: 5 lbs, Left: 7 lbs  Eval: Grip strength: Right: 29.5 lbs; Left: 34.7 lbs and 3 point pinch: Right: 5 lbs, Left: 8 lbs   COORDINATION: Eval: 9 Hole Peg test: Right: 21.7 sec; Left: 21.9 sec (20-22 sec is WFL) no significant pain during    SENSATION: 06/13/21: c/o some tingling dorsum of thumb now, could be slight compression from orthotic when hammering  Eval: 3-4 mm in ulnar and median N distributions in hands. Great   EDEMA: none significant 05/23/21   COGNITION: Eval: Overall cognitive status: Within functional limits for tasks assessed   OBSERVATIONS:  06/13/21: She is not significantly TTP to 1st dorsal compartment now or around thumb CMC Js.  She is tolerating stretch and treatment better now with no pain. Does show/state some fear/apprehension doing functional tasks.   05/30/21: She is less TTP today at snuffbox of rt wrist, tolerates gripping with hand/thumb better as well.     TODAY'S TREATMENT:  06/13/21: She does AROM for exercise and HEP review as well as grip and pinch activities for new measures.  OT also reviews full HEP and recommendations then adds new hand and wrist strength activities as below with t-putty grips and pinches as well as t-band for wrist motions as tolerated, though upper body PRE will need addressed next session. OT also discusses home and work tasks an she states doing better with both, but somewhat fearful to move, and cause pain. She admits that some of this issue could be "mental"at this point.  OT agrees that after being in pain, "guarding" can be from fear and learned behaviors, and this will be addressed through strengthening and resistive activities in clinic and in HEP.   Exercises - Seated Wrist Flexion Stretch  - 2-3 x daily - 3 reps - 15 hold - Wrist Extension Stretch Pronated  - 2-3 x daily - 3 reps - 15 hold - Stretch thumb downward   - 2-3 x daily - 3-5 reps - 15 sec hold - Stretch Thumb into "C-Shape" (don't use other thumb)  - 2-3 x daily - 3-5 reps - 15 sec hold - Towel Roll Grip with Forearm in Neutral  - 2-3 x daily - 3-5 reps - 10 sec hold - Resisted Finger Abduction - Index and Middle  - 2-3 x daily - 5-10 reps - 2-3 sec hold - C-Strength (try using rubber band)   - 2-3 x  daily - 5-10 reps - 5-10 sec hold - Thumb Opposition  - 2-3 x daily - 10 reps - ECCENTRIC HAMMER (SLOWLY LET DOWN)   - 2-3 x daily - 1-2 sets -  5-15 reps - Full Fist  - 2-3 x daily - 3- 5 reps - Thumb Press  - 2-3 x daily - 3- 5 reps - PINCHES  - 2-3 x daily - 5 reps - Finger Extension "Pizza!"   - 2-3 x daily - 3- 5 reps - Seated Claw Fist with Putty  - 2-3 x daily - 5 reps - "Duck Mouth" Strength  - 2-3 x daily - 5 reps    PATIENT EDUCATION: Education details: See tx section above for details  Person educated: Patient Education method: Explanation, Demonstration, Verbal cues, and Handouts Education comprehension: verbalized understanding, returned demonstration, verbal cues required, and needs further education     HOME EXERCISE PROGRAM: Access Code: [email or web address] URL: [email or web address] Prepared by: Benito Mccreedy   GOALS: Goals reviewed with patient? Yes   SHORT TERM GOALS: (STG required if POC>30 days)   Pt will obtain protective, custom orthotic. Target date: 06/02/21 Goal status: MET 05/30/21   2.  Pt will demo/state understanding of initial HEP to improve pain levels and prerequisite motion. Target date: 06/09/21 Goal status: 06/13/21 MET     LONG TERM GOALS:   Pt will improve functional ability by decreased impairment per Quick DASH assessment from 63% to 15% or better, for better quality of life. Target date: 07/07/21 Goal status: 06/13/21- Improving    2.  Pt will improve grip strength in b/l hands from painful ~30lbs each to at least 50lbs each for functional use at home and in IADLs. Target date: 07/07/21 Goal status: INITIAL   3.  Pt will improve A/ROM in right wrist ext from 81* to at least 85* non-painfully, to have decreased pain with functional motion during tasks like reach and grasp.  Target date: 07/07/21 Goal status: INITIAL   4.  Pt will decrease pain at worst from 10/10 to 5/10 or better to have better sleep and occupational  participation in daily roles. Target date: 07/07/21 Goal status: INITIAL   5.  Pt will improve strength in TBD from TBD MMT to at least TBD MMT to have increased functional ability to carry out selfcare and higher-level homecare tasks with no difficulty. Target date: 07/07/21 Goal status: NEW GOAL TBD     ASSESSMENT:   CLINICAL IMPRESSION: 06/13/21: Pain better, tolerance improving as well as function, but weakness is also causing fear and guarding, so OT will go more into full b/l UE strength to increase fnl endurance and strength and decrease disuse and fear to use hands, etc.   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 Symptoms are better after starting therapy, though.      PLAN: OT FREQUENCY: 2x/week   OT DURATION: 6 weeks   PLANNED INTERVENTIONS: self care/ADL training, therapeutic exercise, therapeutic activity, neuromuscular re-education, manual therapy, passive range of motion, splinting, fluidotherapy, compression bandaging, moist heat, cryotherapy, contrast bath, patient/family education, energy conservation, coping strategies training, and DME and/or AE instructions   RECOMMENDED OTHER SERVICES: none now    CONSULTED AND AGREED WITH PLAN OF CARE: Patient   PLAN FOR NEXT SESSION:  Review new t-putty and check MMTs, start PRE for b/l UEs and dynamic activities for strength/endurance as well. Stay mostly non-repetitive and caution for thumb/wrist exacerbations. Also radial nerve glides as needed   Benito Mccreedy, OTR/L, CHT 06/13/2021, 1:23 PM

## 2021-06-20 ENCOUNTER — Ambulatory Visit (INDEPENDENT_AMBULATORY_CARE_PROVIDER_SITE_OTHER): Payer: 59 | Admitting: Orthopedic Surgery

## 2021-06-20 DIAGNOSIS — M24444 Recurrent dislocation, right finger: Secondary | ICD-10-CM | POA: Diagnosis not present

## 2021-06-20 MED ORDER — MELOXICAM 7.5 MG PO TABS: 7.5000 mg | ORAL_TABLET | Freq: Every day | ORAL | 0 refills | Status: AC

## 2021-06-20 NOTE — Progress Notes (Signed)
Office Visit Note   Patient: Denise Williamson           Date of Birth: 1990-04-01           MRN: [id number] Visit Date: 06/20/2021              Requested by: Zetta Bills, MD [address] [place],  [place] [address] PCP: Zetta Bills, MD   Assessment & Plan: Visit Diagnoses:  1. Chronic or recurrent subluxation of carpometacarpal joint of right thumb     Plan: Patient presents for follow-up of atraumatic bilateral thumb CMC pain with joint hypermobility.  She has been formal hand therapy since her last visit in the beginning of May.  She is doing very well with therapy.  Her pain is much improved.  She is getting back into her art.  We will continue hand therapy as she continues to see improvement.  We can also continue anti-inflammatory medication as she thinks this is also providing symptom relief.  I can see her back again in a few months as needed.  Follow-Up Instructions: No follow-ups on file.   Orders:  No orders of the defined types were placed in this encounter.  No orders of the defined types were placed in this encounter.     Procedures: No procedures performed   Clinical Data: No additional findings.   Subjective: Chief Complaint  Patient presents with   Right Thumb - Follow-up    Doing better, PT has helped her    Is a 31 year old right-hand-dominant female presents for follow-up of bilateral atraumatic thumb pain.  She has been having pain in both of her thumbs at the [facility] joint since around 2021.  She has difficulty with certain tasks such as driving, doing laundry, getting dressed, washing her hair, etc.  Her pain was previously localized to the [facility] joint but this is much improved since starting therapy.  She thinks her thumbs are getting stronger.  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  She is overall pleased with her progress so far.   Review of Systems   Objective: Vital Signs: There were no vitals taken for this  visit.  Physical Exam  Right Hand Exam   Tenderness  The patient is experiencing no tenderness.   Other  Erythema: absent Sensation: normal Pulse: present  Comments:  No pain w/ CMC grind test or w/ manipulation of CMC joint.  Unchanged laxity at thumb and MCP joints.    Left Hand Exam   Tenderness  The patient is experiencing no tenderness.   Other  Erythema: absent Sensation: normal Pulse: present  Comments:  No pain w/ CMC grind test or w/ manipulation of CMC joint.  Unchanged laxity at thumb and MCP joints.     Specialty Comments:  No specialty comments available.  Imaging: No results found.   PMFS History: Patient Active Problem List   Diagnosis Date Noted   Chronic or recurrent subluxation of carpometacarpal joint of thumb 05/16/2021   No past medical history on file.  Family History  Problem Relation Age of Onset   Healthy Mother    Healthy Father     No past surgical history on file. Social History   Occupational History   Not on file  Tobacco Use   Smoking status: Never   Smokeless tobacco: Never  Vaping Use   Vaping Use: Never used  Substance and Sexual Activity   Alcohol use: Not on file  Drug use: Not on file   Sexual activity: Not on file

## 2021-06-22 ENCOUNTER — Ambulatory Visit: Payer: 59 | Admitting: Rehabilitative and Restorative Service Providers"

## 2021-06-22 ENCOUNTER — Encounter: Payer: Self-pay | Admitting: Rehabilitative and Restorative Service Providers"

## 2021-06-22 DIAGNOSIS — M79641 Pain in right hand: Secondary | ICD-10-CM | POA: Diagnosis not present

## 2021-06-22 DIAGNOSIS — M79642 Pain in left hand: Secondary | ICD-10-CM

## 2021-06-22 DIAGNOSIS — M25631 Stiffness of right wrist, not elsewhere classified: Secondary | ICD-10-CM

## 2021-06-22 DIAGNOSIS — M25531 Pain in right wrist: Secondary | ICD-10-CM | POA: Diagnosis not present

## 2021-06-22 DIAGNOSIS — M6281 Muscle weakness (generalized): Secondary | ICD-10-CM | POA: Diagnosis not present

## 2021-06-22 NOTE — Therapy (Addendum)
OUTPATIENT OCCUPATIONAL THERAPY TREATMENT & DISCHARGE NOTE   Patient Name: Denise Williamson MRN: [id number] DOB:01-30-1990, 31 y.o., female Today's Date: 06/22/2021  PCP: Dr. Elmarie Shiley REFERRING PROVIDER: Dr. Sherilyn Cooter  END OF SESSION:   OT End of Session - 06/22/21 1021     Visit Number 4    Number of Visits 10    Date for OT Re-Evaluation 07/07/21    OT Start Time 1021    OT Stop Time 1110    OT Time Calculation (min) 49 min    Activity Tolerance Patient tolerated treatment well;Patient limited by fatigue;Patient limited by pain    Behavior During Therapy [facility] for tasks assessed/performed               History reviewed. No pertinent past medical history. History reviewed. No pertinent surgical history. Patient Active Problem List   Diagnosis Date Noted   Chronic or recurrent subluxation of carpometacarpal joint of thumb 05/16/2021    ONSET DATE: pain first started in Oct 2021, got very bad in Oct 2022   REFERRING DIAG:  [phone] (ICD-10-CM) - Bilateral hand pain  M24.444 (ICD-10-CM) - Chronic or recurrent subluxation of carpometacarpal joint of right thumb    THERAPY DIAG:  Pain in left hand  Pain in right hand  Pain in right wrist  Muscle weakness (generalized)  Stiffness of right wrist, not elsewhere classified   PERTINENT HISTORY: Per MD: "Bilateral atraumatic thumb pain, mostly at [facility] joint.  Evidence of hyper-mobility."   PRECAUTIONS: None  SUBJECTIVE:  She states trying to push past her current limitations, both in terms of fear of pain and self-limiting behaviors. She states slight increase in soreness, but tolerable. She continues with HEP stretches, etc. She does plan on getting a new job due to feeling that constant hammer use is detrimental.    PAIN:  Are you having pain?  Yes, Rt thumb  Rating: 3/10 at rest today   OBJECTIVE: (All objective assessments below are from initial evaluation on: 05/23/21 unless otherwise  specified.)   HAND DOMINANCE: Right   ADLs: Overall ADLs: Eval: She states severe pain/problems with light, everyday ADLs like using silverware, opening containers, cooking, etc. She states needing to use k-tape, compression gloves and thumb CMC J brace when at work to "get through it."    FUNCTIONAL OUTCOME MEASURES: 06/13/21: Mikael Spray Dash: 45% impairment today   05/23/21: Katina Dung: 63.6% impairment today     UE ROM   05/23/21: b/l hands make full tight fist, full opposition, does feel tightness/stretches with PROM to thumb joints in flex, opposition, etc.    Active ROM Right 05/23/2021 Left 05/23/2021 Right / Left 06/13/21  Thumb MCP J ext-flexion     (-8*) - 46 (right)  Thumb IP ext - flex      0 - 58  (right)          Shoulder extension       Shoulder internal rotation       Shoulder external rotation       Elbow flexion       Elbow extension       Wrist flexion 81 69 79 / 78  Wrist extension 81 87 81 / 78  Wrist ulnar deviation Tender passively     Wrist radial deviation       Wrist pronation ~85 ~85   Wrist supination ~85 ~85   (Blank rows = not tested)     UE MMT:   05/23/21: grossly lower tone,  but at least 4/5 at most joints, joints around b/l hands/wrists are somewhat painful with resistance. More detailed MMT will be done next session as able.   MMT Right 06/22/21 Left 06/22/21  Shoulder flexion      Shoulder abduction      Shoulder adduction      Shoulder extension      Shoulder internal rotation      Shoulder external rotation      Middle trapezius      Lower trapezius      Elbow flexion 4+/5   4+/5  Elbow extension  4+/5 4/5   Wrist flexion  5/5  4+/5  Wrist extension 4+/5  4/5   Wrist ulnar deviation      Wrist radial deviation      Wrist pronation      Wrist supination      (Blank rows = not tested)   HAND FUNCTION: 06/22/21: 06/13/21: Grip strength: Right: 51 lbs  06/13/21: Grip strength: Right: 36.8 lbs; Left: 30.8 lbs and 3 point pinch: Right: 5 lbs, Left:  7 lbs  Eval: Grip strength: Right: 29.5 lbs; Left: 34.7 lbs and 3 point pinch: Right: 5 lbs, Left: 8 lbs   COORDINATION: Eval: 9 Hole Peg test: Right: 21.7 sec; Left: 21.9 sec (20-22 sec is WFL) no significant pain during   SENSATION: 06/13/21: c/o some tingling dorsum of thumb now, could be slight compression from orthotic when hammering  Eval: 3-4 mm in ulnar and median N distributions in hands. Great   EDEMA: none significant 05/23/21   COGNITION: Eval: Overall cognitive status: Within functional limits for tasks assessed   OBSERVATIONS:  06/13/21: She is not significantly TTP to 1st dorsal compartment now or around thumb CMC Js.  She is tolerating stretch and treatment better now with no pain. Does show/state some fear/apprehension doing functional tasks.   05/30/21: She is less TTP today at snuffbox of rt wrist, tolerates gripping with hand/thumb better as well.     TODAY'S TREATMENT:  06/22/21: She reviews most important parts of HEP with OT today, including wrist stretches (flex, ext, radial deviation), thumb stretches, and hand & wrist strengthening. OT does manual stretches to wrist and thumb as well as IASTM to lengthen tissues and fascia, which she tolerates well. Next, due to low tone/laxity in whole body and especially upper body, OT leads her through safe, functional activity program to increase tone and functional strength, endurance, etc. as listed below:   UBE 4.0 resistance for 5 mins @ 55RPM +  Upper body push: 10# x10, 15# x10 Upper body pull: 15# x10, 25# x8 Biceps curls: 15# 2x10 Triceps pushes: 20# x10, 25# x10  We focused on lower rep, higher resistance for increased tone without repetitious stress to thumb joints or effecting thumb/wrist extensors. She tolerates well but does have significant fatigue today, so we will plan on continuing.    06/13/21: She does AROM for exercise and HEP review as well as grip and pinch activities for new measures.  OT also reviews  full HEP and recommendations then adds new hand and wrist strength activities as below with t-putty grips and pinches as well as t-band for wrist motions as tolerated, though upper body PRE will need addressed next session. OT also discusses home and work tasks an she states doing better with both, but somewhat fearful to move, and cause pain. She admits that some of this issue could be "mental"at this point.  OT agrees that after being in  pain, "guarding" can be from fear and learned behaviors, and this will be addressed through strengthening and resistive activities in clinic and in HEP.   Exercises - Seated Wrist Flexion Stretch  - 2-3 x daily - 3 reps - 15 hold - Wrist Extension Stretch Pronated  - 2-3 x daily - 3 reps - 15 hold - Stretch thumb downward   - 2-3 x daily - 3-5 reps - 15 sec hold - Stretch Thumb into "C-Shape" (don't use other thumb)  - 2-3 x daily - 3-5 reps - 15 sec hold - Towel Roll Grip with Forearm in Neutral  - 2-3 x daily - 3-5 reps - 10 sec hold - Resisted Finger Abduction - Index and Middle  - 2-3 x daily - 5-10 reps - 2-3 sec hold - C-Strength (try using rubber band)   - 2-3 x daily - 5-10 reps - 5-10 sec hold - Thumb Opposition  - 2-3 x daily - 10 reps - ECCENTRIC HAMMER (SLOWLY LET DOWN)   - 2-3 x daily - 1-2 sets - 5-15 reps - Full Fist  - 2-3 x daily - 3- 5 reps - Thumb Press  - 2-3 x daily - 3- 5 reps - PINCHES  - 2-3 x daily - 5 reps - Finger Extension "Pizza!"   - 2-3 x daily - 3- 5 reps - Seated Claw Fist with Putty  - 2-3 x daily - 5 reps - "Duck Mouth" Strength  - 2-3 x daily - 5 reps    PATIENT EDUCATION: Education details: See tx section above for details  Person educated: Patient Education method: Explanation, Demonstration, Verbal cues, and Handouts Education comprehension: verbalized understanding, returned demonstration, verbal cues required, and needs further education     HOME EXERCISE PROGRAM: Access Code: [email or web address] URL:  [email or web address] Prepared by: Benito Mccreedy   GOALS: Goals reviewed with patient? Yes   SHORT TERM GOALS: (STG required if POC>30 days)   Pt will obtain protective, custom orthotic. Target date: 06/02/21 Goal status: MET 05/30/21   2.  Pt will demo/state understanding of initial HEP to improve pain levels and prerequisite motion. Target date: 06/09/21 Goal status: 06/13/21 MET     LONG TERM GOALS:   Pt will improve functional ability by decreased impairment per Quick DASH assessment from 63% to 15% or better, for better quality of life. Target date: 07/07/21 Goal status: 06/13/21- Improving    2.  Pt will improve grip strength in b/l hands from painful ~30lbs each to at least 50lbs each for functional use at home and in IADLs. Target date: 07/07/21 Goal status: INITIAL   3.  Pt will improve A/ROM in right wrist ext from 81* to at least 85* non-painfully, to have decreased pain with functional motion during tasks like reach and grasp.  Target date: 07/07/21 Goal status: INITIAL   4.  Pt will decrease pain at worst from 10/10 to 5/10 or better to have better sleep and occupational participation in daily roles. Target date: 07/07/21 Goal status: INITIAL   5.  Pt will improve strength in TBD from TBD MMT to at least TBD MMT to have increased functional ability to carry out selfcare and higher-level homecare tasks with no difficulty. Target date: 07/07/21 Goal status: NEW GOAL TBD     ASSESSMENT:   CLINICAL IMPRESSION: 06/22/21: She is improving tolerance, resistance, motion, etc. While keeping pain levels modest. We will continue on per POC 2 more weeks.   06/13/21: Pain better, tolerance improving as well  as function, but weakness is also causing fear and guarding, so OT will go more into full b/l UE strength to increase fnl endurance and strength and decrease disuse and fear to use hands, etc.   05/30/21: Continues to present similar to a mix of DeQuervain's, thumb  CMC J OA, and possibly EDS. Symptoms are better after starting therapy, though.      PLAN: OT FREQUENCY: d/c   OT DURATION: d/c   PLANNED INTERVENTIONS: self care/ADL training, therapeutic exercise, therapeutic activity, neuromuscular re-education, manual therapy, passive range of motion, splinting, fluidotherapy, compression bandaging, moist heat, cryotherapy, contrast bath, patient/family education, energy conservation, coping strategies training, and DME and/or AE instructions   RECOMMENDED OTHER SERVICES: none now    CONSULTED AND AGREED WITH PLAN OF CARE: Patient   PLAN FOR NEXT SESSION:  Go over "main" HEP again, stretches and manual therapy as needed and continue full upper functional strength. Also radial nerve glides as needed   Benito Mccreedy, OTR/L, CHT 06/22/2021, 3:04 PM    OCCUPATIONAL THERAPY DISCHARGE SUMMARY  Visits from Start of Care: 4  Current functional level related to goals / functional outcomes: Pt did not return to therapy final assessment could not be done. She will be discharged officially at this point.   Remaining deficits: Please see notes   Education / Equipment: Please see notes   Benito Mccreedy, OTR/L, CHT 08/28/21

## 2021-06-28 ENCOUNTER — Encounter (HOSPITAL_COMMUNITY): Payer: Self-pay | Admitting: Emergency Medicine

## 2021-06-28 ENCOUNTER — Other Ambulatory Visit: Payer: Self-pay

## 2021-06-28 ENCOUNTER — Ambulatory Visit (HOSPITAL_COMMUNITY)
Admission: EM | Admit: 2021-06-28 | Discharge: 2021-06-28 | Disposition: A | Discharge status: Home / Self Care | Payer: 59 | Attending: Emergency Medicine | Admitting: Emergency Medicine

## 2021-06-28 ENCOUNTER — Ambulatory Visit (INDEPENDENT_AMBULATORY_CARE_PROVIDER_SITE_OTHER): Payer: 59

## 2021-06-28 DIAGNOSIS — K5904 Chronic idiopathic constipation: Secondary | ICD-10-CM

## 2021-06-28 DIAGNOSIS — R109 Unspecified abdominal pain: Secondary | ICD-10-CM

## 2021-06-28 MED ORDER — LINACLOTIDE 145 MCG PO CAPS: 145.0000 ug | ORAL_CAPSULE | Freq: Every day | ORAL | 0 refills | Status: DC

## 2021-06-28 NOTE — ED Provider Notes (Signed)
[facility]    CSN: [id number] Arrival date & time: 06/28/21  1705    HISTORY   Chief Complaint  Patient presents with   Abdominal Pain   HPI Denise Williamson is a 31 y.o. female. Patient presents to urgent care with her partner.  Patient states that earlier today morning she began to have abdominal pain that was initially epigastric but it is since moved into the right side of her lower abdomen.  Patient denies vomiting and diarrhea, endorses some slight nausea.  Patient states she had a bowel movement this morning that was "normal".  Patient denies fever, aches, chills, or upper respiratory symptoms.  Patient has normal vital signs on arrival today and appears to be in no acute distress.  The history is provided by the patient.   History reviewed. No pertinent past medical history. Patient Active Problem List   Diagnosis Date Noted   Chronic or recurrent subluxation of carpometacarpal joint of thumb 05/16/2021   History reviewed. No pertinent surgical history. OB History   No obstetric history on file.    Home Medications    Prior to Admission medications   Medication Sig Start Date End Date Taking? Authorizing Provider  linaclotide [facility]) 145 MCG CAPS capsule Take 1 capsule (145 mcg total) by mouth daily before breakfast. 06/28/21 07/28/21 Yes Theadora Rama Scales, PA-C  BLISOVI FE 1/20 1-20 MG-MCG tablet Take 1 tablet by mouth daily. 04/25/21   [provider]  meloxicam (MOBIC) 7.5 MG tablet Take 1 tablet (7.5 mg total) by mouth daily. 06/20/21 07/20/21  Marlyne Beards, MD    Family History Family History  Problem Relation Age of Onset   Healthy Mother    Healthy Father    Social History Social History   Tobacco Use   Smoking status: Never   Smokeless tobacco: Never  Vaping Use   Vaping Use: Never used  Substance Use Topics   Alcohol use: Never   Drug use: Never   Allergies   Patient has no known allergies.  Review of  Systems Review of Systems Pertinent findings noted in history of present illness.   Physical Exam Triage Vital Signs ED Triage Vitals  Enc Vitals Group     BP 11/11/20 0827 (!) 147/82     Pulse Rate 11/11/20 0827 72     Resp 11/11/20 0827 18     Temp 11/11/20 0827 98.3 F (36.8 C)     Temp Source 11/11/20 0827 Oral     SpO2 11/11/20 0827 98 %     Weight --      Height --      Head Circumference --      Peak Flow --      Pain Score 11/11/20 0826 5     Pain Loc --      Pain Edu? --      Excl. in GC? --   No data found.  Updated Vital Signs BP 105/73 (BP Location: Left Arm)   Pulse 98   Temp 98.3 F (36.8 C) (Oral)   Resp 18   SpO2 99%   Physical Exam Vitals and nursing note reviewed.  Constitutional:      General: She is not in acute distress.    Appearance: Normal appearance. She is not ill-appearing.  HENT:     Head: Normocephalic and atraumatic.  Eyes:     General: Lids are normal.        Right eye: No discharge.  Left eye: No discharge.     Extraocular Movements: Extraocular movements intact.     Conjunctiva/sclera: Conjunctivae normal.     Right eye: Right conjunctiva is not injected.     Left eye: Left conjunctiva is not injected.  Neck:     Trachea: Trachea and phonation normal.  Cardiovascular:     Rate and Rhythm: Normal rate and regular rhythm.     Pulses: Normal pulses.     Heart sounds: Normal heart sounds. No murmur heard.    No friction rub. No gallop.  Pulmonary:     Effort: Pulmonary effort is normal. No accessory muscle usage, prolonged expiration or respiratory distress.     Breath sounds: Normal breath sounds. No stridor, decreased air movement or transmitted upper airway sounds. No decreased breath sounds, wheezing, rhonchi or rales.  Chest:     Chest wall: No tenderness.  Abdominal:     General: Abdomen is flat. Bowel sounds are normal. There is no distension.     Palpations: Abdomen is soft.     Tenderness: There is abdominal  tenderness in the right upper quadrant, right lower quadrant and periumbilical area. There is no right CVA tenderness or left CVA tenderness.     Hernia: No hernia is present.  Musculoskeletal:        General: Normal range of motion.     Cervical back: Normal range of motion and neck supple. Normal range of motion.  Lymphadenopathy:     Cervical: No cervical adenopathy.  Skin:    General: Skin is warm and dry.     Findings: No erythema or rash.  Neurological:     General: No focal deficit present.     Mental Status: She is alert and oriented to person, place, and time.  Psychiatric:        Mood and Affect: Mood normal.        Behavior: Behavior normal.     Visual Acuity Right Eye Distance:   Left Eye Distance:   Bilateral Distance:    Right Eye Near:   Left Eye Near:    Bilateral Near:     [facility] / Diagnostics / Procedures:    EKG  Radiology DG Abd 1 View  Result Date: 06/28/2021 CLINICAL DATA:  Right-sided abdominal pain. EXAM: ABDOMEN - 1 VIEW COMPARISON:  None Available. FINDINGS: The bowel gas pattern is normal. No radio-opaque calculi or other significant radiographic abnormality are seen. IMPRESSION: Negative. Electronically Signed   By: Darliss CheneyAmy  Guttmann M.D.   On: 06/28/2021 19:07    Procedures Procedures (including critical care time)  UC Diagnoses / Final Clinical Impressions(s)   I have reviewed the triage vital signs and the nursing notes.  Pertinent labs & imaging results that were available during my care of the patient were reviewed by me and considered in my medical decision making (see chart for details).    Final diagnoses:  Chronic idiopathic constipation   Patient advised of x-ray findings, recommend she begin Linzess to treat her symptoms and follow-up with her primary care provider as needed.  ED Prescriptions     Medication Sig Dispense Auth. Provider   linaclotide (LINZESS) 145 MCG CAPS capsule Take 1 capsule (145 mcg total) by mouth daily  before breakfast. 30 capsule Theadora RamaMorgan, Arneta Mahmood Scales, PA-C      PDMP not reviewed this encounter.  Pending results:  Labs Reviewed - No data to display  Medications Ordered in UC: Medications - No data to display  Disposition Upon  Discharge:  Condition: stable for discharge home Home: take medications as prescribed; routine discharge instructions as discussed; follow up as advised.  Patient presented with an acute illness with associated systemic symptoms and significant discomfort requiring urgent management. In my opinion, this is a condition that a prudent lay person (someone who possesses an average knowledge of health and medicine) may potentially expect to result in complications if not addressed urgently such as respiratory distress, impairment of bodily function or dysfunction of bodily organs.   Routine symptom specific, illness specific and/or disease specific instructions were discussed with the patient and/or caregiver at length.   As such, the patient has been evaluated and assessed, work-up was performed and treatment was provided in alignment with urgent care protocols and evidence based medicine.  Patient/parent/caregiver has been advised that the patient may require follow up for further testing and treatment if the symptoms continue in spite of treatment, as clinically indicated and appropriate.  Patient/parent/caregiver has been advised to return to the [facility] or PCP if no better; to PCP or the Emergency Department if new signs and symptoms develop, or if the current signs or symptoms continue to change or worsen for further workup, evaluation and treatment as clinically indicated and appropriate  The patient will follow up with their current PCP if and as advised. If the patient does not currently have a PCP we will assist them in obtaining one.   The patient may need specialty follow up if the symptoms continue, in spite of conservative treatment and management, for further  workup, evaluation, consultation and treatment as clinically indicated and appropriate.   Patient/parent/caregiver verbalized understanding and agreement of plan as discussed.  All questions were addressed during visit.  Please see discharge instructions below for further details of plan.  Discharge Instructions:   Discharge Instructions      Your abdominal x-ray revealed significant amount of stool on the right side of your abdomen which is the most likely cause of your pain at this time.  I recommend that you begin a medication called Linzess tomorrow morning, please take 1 capsule daily before breakfast.  Please follow-up with your primary care provider if you find this medication helpful and would like to continue taking it on a regular basis.  Thank you for visiting urgent care today.    This office note has been dictated using Teaching laboratory technician.  Unfortunately, and despite my best efforts, this method of dictation can sometimes lead to occasional typographical or grammatical errors.  I apologize in advance if this occurs.     Theadora Rama Scales, PA-C 06/30/21 1350

## 2021-06-28 NOTE — ED Triage Notes (Signed)
ONSET OF ABDOMINAL PAIN WAS EARLY YESTERDAY MORNING.  INITIALLY PAIN WAS EPIGASTRIC, TODAY, PAIN HAS MOVED LOWER ON ABDOMEN AND IS PREDOMINANTLY ON RIGHT SIDE.  DENIES VOMITING OR DIARRHEA, SLIGHT NAUSEA

## 2021-06-28 NOTE — Discharge Instructions (Addendum)
Your abdominal x-ray revealed significant amount of stool on the right side of your abdomen which is the most likely cause of your pain at this time.  I recommend that you begin a medication called Linzess tomorrow morning, please take 1 capsule daily before breakfast.  Please follow-up with your primary care provider if you find this medication helpful and would like to continue taking it on a regular basis.  Thank you for visiting urgent care today.

## 2021-06-29 ENCOUNTER — Encounter: Payer: 59 | Admitting: Rehabilitative and Restorative Service Providers"

## 2021-07-01 ENCOUNTER — Telehealth: Payer: Self-pay | Admitting: Emergency Medicine

## 2021-07-01 MED ORDER — TRULANCE 3 MG PO TABS: 1.0000 | ORAL_TABLET | Freq: Every morning | ORAL | 0 refills | Status: AC

## 2021-07-01 NOTE — Telephone Encounter (Signed)
Patient's insurance would not pay for Linzess.  Trulance ordered instead.
